# Patient Record
Sex: Female | Born: 1990 | Race: Black or African American | Hispanic: No | Marital: Single
Health system: Southern US, Community
[De-identification: ages and names within clinical notes are randomized; demographics above are authoritative.]

---
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
## 2014-12-25 ENCOUNTER — Telehealth: Payer: Self-pay | Admitting: Orthopedic Surgery
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
